# Patient Record
Sex: Female | Born: 2000 | Race: Black or African American | Hispanic: No | Marital: Single | Smoking: Never smoker
Health system: Southern US, Community
[De-identification: ages and names within clinical notes are randomized; demographics above are authoritative.]

## PROBLEM LIST (undated history)

## (undated) DIAGNOSIS — J45909 Unspecified asthma, uncomplicated: Secondary | ICD-10-CM

---
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
## 2021-01-10 ENCOUNTER — Emergency Department (HOSPITAL_COMMUNITY)
Admission: EM | Admit: 2021-01-10 | Discharge: 2021-01-10 | Disposition: A | Discharge status: Home / Self Care | Payer: Medicaid Other | Attending: Emergency Medicine | Admitting: Emergency Medicine

## 2021-01-10 ENCOUNTER — Other Ambulatory Visit: Payer: Self-pay

## 2021-01-10 ENCOUNTER — Encounter (HOSPITAL_COMMUNITY): Payer: Self-pay | Admitting: Emergency Medicine

## 2021-01-10 DIAGNOSIS — R070 Pain in throat: Secondary | ICD-10-CM | POA: Diagnosis present

## 2021-01-10 DIAGNOSIS — M25552 Pain in left hip: Secondary | ICD-10-CM | POA: Diagnosis not present

## 2021-01-10 DIAGNOSIS — J029 Acute pharyngitis, unspecified: Secondary | ICD-10-CM | POA: Insufficient documentation

## 2021-01-10 DIAGNOSIS — Z20822 Contact with and (suspected) exposure to covid-19: Secondary | ICD-10-CM | POA: Diagnosis not present

## 2021-01-10 DIAGNOSIS — J45909 Unspecified asthma, uncomplicated: Secondary | ICD-10-CM | POA: Insufficient documentation

## 2021-01-10 LAB — RESP PANEL BY RT-PCR (FLU A&B, COVID) ARPGX2
Influenza A by PCR: NEGATIVE
Influenza B by PCR: NEGATIVE
SARS Coronavirus 2 by RT PCR: NEGATIVE

## 2021-01-10 LAB — GROUP A STREP BY PCR: Group A Strep by PCR: NOT DETECTED

## 2021-01-10 MED ORDER — DEXAMETHASONE 4 MG PO TABS
10.0000 mg | ORAL_TABLET | Freq: Once | ORAL | Status: AC
  Administered 2021-01-10: 10 mg via ORAL
  Filled 2021-01-10: qty 3

## 2021-01-10 NOTE — ED Provider Notes (Signed)
[facility] EMERGENCY DEPARTMENT Provider Note   CSN: [id number] Arrival date & time: 01/10/21  1153     History Chief Complaint  Patient presents with  . Sore Throat    Kristen Strong is a 20 y.o. female.  HPI Presents with 4 days of sore throat.  Has a little bit of pain with swallowing, no difficulty swallowing.  No difficulty breathing.  No chest pain.  No history of blood clots, focal leg swelling.  No abdominal pain or nausea or vomiting.  No significant past medical history and does not take daily medications.  She also notes that she has been evaluated 3 or so times for left hip pain which she describes as in her hip and points to her left gluteus feels like a sharp pain.  Feels it when she sits for too long or drives for long period of time.  Ambulatory.  No radiating pain.  No numbness or tingling in her distal extremities.  No weakness.    Past Medical History:  Diagnosis Date  . Asthma     There are no problems to display for this patient.   History reviewed. No pertinent surgical history.   OB History   No obstetric history on file.     History reviewed. No pertinent family history.  Social History   Tobacco Use  . Smoking status: Never Smoker  . Smokeless tobacco: Never Used    Home Medications Prior to Admission medications   Medication Sig Start Date End Date Taking? Authorizing Provider  benzonatate (TESSALON) 100 MG capsule Take 1 capsule (100 mg total) by mouth every 8 (eight) hours. 09/05/20   Henderly, Britni A, PA-C  naproxen (NAPROSYN) 500 MG tablet Take 1 tablet (500 mg total) by mouth 2 (two) times daily as needed for moderate pain. 12/28/20   Petrucelli, Samantha R, PA-C    Allergies    Other and Shellfish allergy  Review of Systems   Review of Systems  Constitutional: Negative for chills and fever.  HENT: Positive for sore throat. Negative for trouble swallowing.   Eyes: Negative for pain and visual disturbance.   Respiratory: Negative for cough and shortness of breath.   Cardiovascular: Negative for chest pain and palpitations.  Gastrointestinal: Negative for abdominal pain and vomiting.  Genitourinary: Negative for dysuria and hematuria.  Musculoskeletal: Negative for arthralgias and back pain.  Skin: Negative for color change and rash.  Neurological: Negative for seizures and syncope.  All other systems reviewed and are negative.   Physical Exam Updated Vital Signs BP 100/61 (BP Location: Right Arm)   Pulse 80   Temp 98.5 F (36.9 C) (Oral)   Resp 16   LMP 01/03/2021   SpO2 97%   Physical Exam Vitals and nursing note reviewed.  Constitutional:      General: She is not in acute distress.    Appearance: She is well-developed.  HENT:     Head: Normocephalic and atraumatic.     Comments: No uvular deviation, focal intraoral swelling.  No cervical lymphadenopathy.    Mouth/Throat:     Mouth: Mucous membranes are moist.     Pharynx: Oropharynx is clear. No oropharyngeal exudate or posterior oropharyngeal erythema.  Eyes:     Conjunctiva/sclera: Conjunctivae normal.  Cardiovascular:     Rate and Rhythm: Normal rate and regular rhythm.     Heart sounds: No murmur heard.   Pulmonary:     Effort: Pulmonary effort is normal. No respiratory distress.  Breath sounds: Normal breath sounds. No wheezing.  Abdominal:     Palpations: Abdomen is soft.     Tenderness: There is no abdominal tenderness.  Musculoskeletal:        General: No swelling or deformity.     Cervical back: Neck supple.     Comments: Left ischial ttp. LLE NVI SLR negative on the L  Skin:    General: Skin is warm and dry.  Neurological:     Mental Status: She is alert.     Comments:   MOTOR: 5/5 in both upper and lower extremities   SENSORY: Equal and symmetric in bilateral upper and lower extremities   COORD: Gait normal     ED Results / Procedures / Treatments   Labs (all labs ordered are listed, but  only abnormal results are displayed) Labs Reviewed  RESP PANEL BY RT-PCR (FLU A&B, COVID) ARPGX2  GROUP A STREP BY PCR    EKG None  Radiology No results found.  Procedures Procedures   Medications Ordered in ED Medications  dexamethasone (DECADRON) tablet 10 mg (10 mg Oral Given 01/10/21 1424)    ED Course  I have reviewed the triage vital signs and the nursing notes.  Pertinent labs & imaging results that were available during my care of the patient were reviewed by me and considered in my medical decision making (see chart for details).    MDM Rules/Calculators/A&P                          Patient presents with sore throat.  Strep and COVID test are negative.  No signs of emergent upper airway complication or bacterial infection or bacterial sinusitis or difficulty breathing or swallowing.  Her left lower extremity is neurovascular intact, she has had no trauma to explain her musculoskeletal findings, I offered x-ray but we agreed this is not necessary because it would likely be negative.  Her pain does seem musculoskeletal in nature and there are no red flag symptoms such as urinary retention or difficulty with defecation or numbness or tingling down her legs or weakness.  I provided her the information for sports medicine so she can follow-up to consider further thorough diagnostic testing and management.  Final Clinical Impression(s) / ED Diagnoses Final diagnoses:  Pharyngitis, unspecified etiology    Rx / DC Orders ED Discharge Orders    None       Jacklynn Bue, MD 01/11/21 1756    Melene Plan, DO 01/11/21 2304

## 2021-01-10 NOTE — ED Triage Notes (Signed)
Pt states she has a sore throat when she swallows for four days. Pt has had pain in her lower back for 3 months. Pt has been seen for it in the past, with no relief.

## 2021-01-10 NOTE — Discharge Instructions (Addendum)
Your strep and COVID test are negative.  You received Decadron in the ER which can help with your sore throat as well as your gluteal pain.  The sports medicine phone number is listed for follow-up due to your pain in your hip.  Please come back if you have difficulty breathing or difficulty swallowing, lightheadedness, passing out, or other emergencies.  You may continue to use ibuprofen and Tylenol for pain until you follow-up with sports medicine

## 2021-01-10 NOTE — ED Provider Notes (Signed)
Emergency Medicine Provider Triage Evaluation Note  Kristen Strong , a 20 y.o. female  was evaluated in triage.  Pt complains of sore throat x4 days.  Review of Systems  Positive: Sore throat Negative: Fever, cough, congestion  Physical Exam  BP 105/69 (BP Location: Left Arm)   Pulse (!) 101   Temp 99 F (37.2 C) (Oral)   Resp 17   LMP 01/03/2021   SpO2 97%  Gen:   Awake, no distress   Resp:  Normal effort  MSK:   Moves extremities without difficulty  Other:  No oropharyngeal erythema, or exudate. No unilateral tonsillar swelling.  Medical Decision Making  Medically screening exam initiated at 12:29 PM.  Appropriate orders placed.  Kristen Strong was informed that the remainder of the evaluation will be completed by another provider, this initial triage assessment does not replace that evaluation, and the importance of remaining in the ED until their evaluation is complete.     Karrie Meres, PA-C 01/10/21 1229    Melene Plan, DO 01/10/21 1536

## 2021-01-11 ENCOUNTER — Ambulatory Visit: Payer: Medicaid Other | Admitting: Family Medicine

## 2021-01-24 ENCOUNTER — Other Ambulatory Visit: Payer: Self-pay

## 2021-01-24 ENCOUNTER — Emergency Department (HOSPITAL_COMMUNITY)
Admission: EM | Admit: 2021-01-24 | Discharge: 2021-01-24 | Disposition: A | Discharge status: Home / Self Care | Payer: Medicaid Other | Attending: Emergency Medicine | Admitting: Emergency Medicine

## 2021-01-24 ENCOUNTER — Encounter (HOSPITAL_COMMUNITY): Payer: Self-pay | Admitting: Emergency Medicine

## 2021-01-24 DIAGNOSIS — Z8669 Personal history of other diseases of the nervous system and sense organs: Secondary | ICD-10-CM | POA: Diagnosis not present

## 2021-01-24 DIAGNOSIS — R42 Dizziness and giddiness: Secondary | ICD-10-CM | POA: Diagnosis not present

## 2021-01-24 DIAGNOSIS — W010XXA Fall on same level from slipping, tripping and stumbling without subsequent striking against object, initial encounter: Secondary | ICD-10-CM | POA: Diagnosis not present

## 2021-01-24 DIAGNOSIS — R55 Syncope and collapse: Secondary | ICD-10-CM | POA: Diagnosis present

## 2021-01-24 DIAGNOSIS — R519 Headache, unspecified: Secondary | ICD-10-CM | POA: Insufficient documentation

## 2021-01-24 DIAGNOSIS — W19XXXA Unspecified fall, initial encounter: Secondary | ICD-10-CM

## 2021-01-24 NOTE — ED Triage Notes (Signed)
Patient reports slip and fall getting out of shower today hitting head with LOC.

## 2021-01-24 NOTE — Discharge Instructions (Signed)
You came to the emergency department to be evaluated for your fall and head injury.  Your exam was unremarkable.  Low suspicion for cranial abnormality at this time.  Get help right away if: You have: A severe headache that is not helped by medicine. Trouble walking or weakness in your arms and legs. Clear or bloody fluid coming from your nose or ears. Changes in your vision. A seizure. Increased confusion or irritability. Your symptoms get worse. You are sleepier than normal and have trouble staying awake. You lose your balance. Your pupils change size. Your speech is slurred. Your dizziness gets worse. You vomit.

## 2021-01-24 NOTE — ED Provider Notes (Signed)
[facility]-EMERGENCY DEPT Provider Note   CSN: [id number] Arrival date & time: 01/24/21  1404     History Chief Complaint  Patient presents with  . Fall    Kristen Strong is a 20 y.o. female with past medical history of asthma.  Patient presents with chief complaint of fall.  She reports that she fell approximately 1030 this morning.  Patient reports that she slipped on a wet floor.  Patient is unsure if she hit her head.  Patient believes that she had an episode of loss of consciousness.  Patient unsure how long this episode occurred 4.  Patient awoke with dizziness, into the back of her head and headache.  At present patient rates her headache 4/10 on the pain scale.  Patient denies any alleviating or aggravating factors.  This is not the worst headache of her life.  Patient denies any aggravation of headache with exertion.  Patient has history of migraines.  Patient has not taken any medication for her headache.  Patient rates pain to the back of her head 3/10 the pain scale.  No alleviating or aggravating factors.  No radiation of pain.  Patient unsure if she has any contusions or wounds to her head.  Patient denies any neck pain, back pain, nausea, vomiting, visual disturbance, numbness to extremities, weakness to extremities, saddle anesthesia, bowel or bladder dysfunction, memory loss, seizures, asymmetry, slurred speech.    Patient is not on any blood thinners.  She denies any alcohol or drug use today.  HPI     Past Medical History:  Diagnosis Date  . Asthma     There are no problems to display for this patient.   History reviewed. No pertinent surgical history.   OB History   No obstetric history on file.     No family history on file.  Social History   Tobacco Use  . Smoking status: Never Smoker  . Smokeless tobacco: Never Used    Home Medications Prior to Admission medications   Medication Sig Start Date End Date Taking? Authorizing  Provider  benzonatate (TESSALON) 100 MG capsule Take 1 capsule (100 mg total) by mouth every 8 (eight) hours. 09/05/20   Henderly, Britni A, PA-C  naproxen (NAPROSYN) 500 MG tablet Take 1 tablet (500 mg total) by mouth 2 (two) times daily as needed for moderate pain. 12/28/20   Petrucelli, Samantha R, PA-C    Allergies    Other and Shellfish allergy  Review of Systems   Review of Systems  HENT: Negative for facial swelling.   Eyes: Negative for visual disturbance.  Respiratory: Negative for shortness of breath.   Cardiovascular: Negative for chest pain.  Gastrointestinal: Negative for nausea and vomiting.  Genitourinary: Negative for difficulty urinating and dysuria.  Musculoskeletal: Negative for back pain, neck pain and neck stiffness.  Skin: Negative for color change, pallor, rash and wound.  Neurological: Positive for dizziness and headaches. Negative for tremors, seizures, syncope, facial asymmetry, speech difficulty, weakness, light-headedness and numbness.  Hematological: Does not bruise/bleed easily.  Psychiatric/Behavioral: Negative for confusion.    Physical Exam Updated Vital Signs BP 108/74 (BP Location: Left Arm)   Pulse (!) 106   Temp 98.6 F (37 C) (Oral)   Resp 16   LMP 01/24/2021   SpO2 96%   Physical Exam Vitals and nursing note reviewed.  Constitutional:      General: She is not in acute distress.    Appearance: She is not ill-appearing, toxic-appearing or diaphoretic.  HENT:     Head: Normocephalic and atraumatic. No raccoon eyes, Battle's sign, abrasion, contusion, masses, right periorbital erythema, left periorbital erythema or laceration.     Jaw: No trismus or pain on movement.     Comments: No contusion, bleeding, or swelling to patient's head    Right Ear: No drainage.     Left Ear: No drainage.     Nose: No rhinorrhea.  Eyes:     General: No scleral icterus.       Right eye: No discharge.        Left eye: No discharge.     Extraocular  Movements: Extraocular movements intact.     Pupils: Pupils are equal, round, and reactive to light.  Cardiovascular:     Rate and Rhythm: Normal rate.  Pulmonary:     Effort: Pulmonary effort is normal. No respiratory distress.     Breath sounds: No stridor.     Comments: Patient able speak in full sentences without difficulty Abdominal:     Palpations: Abdomen is soft.     Tenderness: There is no abdominal tenderness.  Musculoskeletal:     Cervical back: Normal range of motion and neck supple. No swelling, edema, deformity, erythema, signs of trauma, lacerations, rigidity, spasms, torticollis, tenderness, bony tenderness or crepitus. No pain with movement, spinous process tenderness or muscular tenderness. Normal range of motion.     Thoracic back: No swelling, edema, deformity, signs of trauma, lacerations, spasms, tenderness or bony tenderness.     Lumbar back: No swelling, edema, deformity, signs of trauma, lacerations, spasms, tenderness or bony tenderness.     Comments: No midline tenderness bony to cervical, thoracic, or lumbar spine  Tenderness, deformity, or bony tenderness to bilateral upper or lower extremities  Skin:    General: Skin is warm and dry.     Coloration: Skin is not jaundiced or pale.  Neurological:     General: No focal deficit present.     Mental Status: She is alert and oriented to person, place, and time.     GCS: GCS eye subscore is 4. GCS verbal subscore is 5. GCS motor subscore is 6.     Cranial Nerves: No cranial nerve deficit or facial asymmetry.     Sensory: Sensation is intact.     Motor: No weakness, tremor, seizure activity or pronator drift.     Coordination: Romberg sign negative. Finger-Nose-Finger Test normal.     Gait: Gait is intact. Gait and tandem walk normal.     Comments: CN II-XII intact, equal grip strength, +5 strength to bilateral upper and lower extremities, sensation to light touch intact to bilateral upper and lower extremities    Psychiatric:        Behavior: Behavior is cooperative.     ED Results / Procedures / Treatments   Labs (all labs ordered are listed, but only abnormal results are displayed) Labs Reviewed - No data to display  EKG None  Radiology No results found.  Procedures Procedures   Medications Ordered in ED Medications - No data to display  ED Course  I have reviewed the triage vital signs and the nursing notes.  Pertinent labs & imaging results that were available during my care of the patient were reviewed by me and considered in my medical decision making (see chart for details).    MDM Rules/Calculators/A&P  Alert 20 year old female no acute stress, nontoxic.  Patient presents with complaint of mechanical fall.  Patient endorses loss of consciousness.  On physical exam patient has no focal neurological deficits.  No tenderness or deformity to cervical, thoracic, or lumbar spine.  Head is atraumatic.  No deformity, tenderness, or bony tenderness to bilateral upper and lower extremities.  Low suspicion for intracranial abnormality or spinal injury.  CT head imaging ruled out by CT and head injury patient's prediction rule.    Patient given strict return precautions.  Patient expressed understanding of all instructions and is agreeable with this plan.   Final Clinical Impression(s) / ED Diagnoses Final diagnoses:  Fall, initial encounter    Rx / DC Orders ED Discharge Orders    None       Haskel Schroeder, PA-C 01/25/21 0244    Vanetta Mulders, MD 01/26/21 [phone]

## 2021-02-03 ENCOUNTER — Emergency Department (HOSPITAL_COMMUNITY)
Admission: EM | Admit: 2021-02-03 | Discharge: 2021-02-04 | Disposition: A | Discharge status: Home / Self Care | Payer: Medicaid Other | Attending: Emergency Medicine | Admitting: Emergency Medicine

## 2021-02-03 DIAGNOSIS — R5383 Other fatigue: Secondary | ICD-10-CM | POA: Diagnosis not present

## 2021-02-03 DIAGNOSIS — M549 Dorsalgia, unspecified: Secondary | ICD-10-CM | POA: Insufficient documentation

## 2021-02-03 DIAGNOSIS — J45909 Unspecified asthma, uncomplicated: Secondary | ICD-10-CM | POA: Insufficient documentation

## 2021-02-03 DIAGNOSIS — R6883 Chills (without fever): Secondary | ICD-10-CM | POA: Insufficient documentation

## 2021-02-03 DIAGNOSIS — R0981 Nasal congestion: Secondary | ICD-10-CM | POA: Insufficient documentation

## 2021-02-03 DIAGNOSIS — Z8616 Personal history of COVID-19: Secondary | ICD-10-CM | POA: Insufficient documentation

## 2021-02-03 DIAGNOSIS — Z20822 Contact with and (suspected) exposure to covid-19: Secondary | ICD-10-CM | POA: Diagnosis not present

## 2021-02-03 NOTE — ED Triage Notes (Signed)
Patient arrived with complaints of chills, cough, and runny nose since yesterday, known recent covid-19 exposure.   Roughly 4 months ago she also started having pain from her tailbone that is now radiating to bilateral legs and upper back causing pain and tingling. Stating she has trouble walking down stairs. No known injury.

## 2021-02-04 ENCOUNTER — Emergency Department (HOSPITAL_COMMUNITY): Payer: Medicaid Other

## 2021-02-04 ENCOUNTER — Encounter (HOSPITAL_COMMUNITY): Payer: Self-pay

## 2021-02-04 LAB — SARS CORONAVIRUS 2 (TAT 6-24 HRS): SARS Coronavirus 2: NEGATIVE

## 2021-02-04 LAB — POC URINE PREG, ED: Preg Test, Ur: NEGATIVE

## 2021-02-04 NOTE — ED Provider Notes (Signed)
[facility]-EMERGENCY DEPT Provider Note   CSN: [id number] Arrival date & time: 02/03/21  2318     History Chief Complaint  Patient presents with  . Covid Exposure  . Leg Pain    Kristen Strong is a 20 y.o. female.  HPI  20 y.o. F with hx of asthma, presenting to the ED for multiple concerns.  1.  covid exposure-- states onset of chills, body aches, fatigue, and nasal congestion since yesterday.  covid has been spreading around her coworkers.  Reports she had covid already in Jan 2022.  Denies chest pain or SOB.  2.  Back pain-- has been ongoing for about 4 months now.  Triage note reports a fall but she denies this or any other trauma.  States she just has pain all the time, radiates between low back and tailbone.  Worse with certain movements like going up the stairs. No leg numbness/weakness.  No bowel or bladder incontinence.  States she has been seen in the ED and urgent care a few times for this without change.  Past Medical History:  Diagnosis Date  . Asthma     There are no problems to display for this patient.   History reviewed. No pertinent surgical history.   OB History   No obstetric history on file.     No family history on file.  Social History   Tobacco Use  . Smoking status: Never Smoker  . Smokeless tobacco: Never Used    Home Medications Prior to Admission medications   Medication Sig Start Date End Date Taking? Authorizing Provider  albuterol (VENTOLIN HFA) 108 (90 Base) MCG/ACT inhaler Inhale 1-2 puffs into the lungs every 6 (six) hours as needed for wheezing or shortness of breath.   Yes [provider]  escitalopram (LEXAPRO) 5 MG tablet Take 5 mg by mouth daily as needed. 10/12/20  Yes [provider]  SUMAtriptan (IMITREX) 25 MG tablet Take 25 mg by mouth 2 (two) times daily as needed. 10/12/20  Yes [provider]  benzonatate (TESSALON) 100 MG capsule Take 1 capsule (100 mg total) by mouth every 8  (eight) hours. Patient not taking: Reported on 02/04/2021 09/05/20   Henderly, Britni A, PA-C  naproxen (NAPROSYN) 500 MG tablet Take 1 tablet (500 mg total) by mouth 2 (two) times daily as needed for moderate pain. Patient not taking: Reported on 02/04/2021 12/28/20   Petrucelli, Pleas Koch, PA-C    Allergies    Other and Shellfish allergy  Review of Systems   Review of Systems  Physical Exam Updated Vital Signs BP 125/79 (BP Location: Left Arm)   Pulse 95   Temp 98.6 F (37 C) (Oral)   Resp 19   Ht 5' (1.524 m)   Wt 54.4 kg   LMP 02/04/2021   SpO2 98%   BMI 23.44 kg/m   Physical Exam Vitals and nursing note reviewed.  Constitutional:      Appearance: She is well-developed.  HENT:     Head: Normocephalic and atraumatic.  Eyes:     Conjunctiva/sclera: Conjunctivae normal.     Pupils: Pupils are equal, round, and reactive to light.  Cardiovascular:     Rate and Rhythm: Normal rate and regular rhythm.     Heart sounds: Normal heart sounds.  Pulmonary:     Effort: Pulmonary effort is normal.     Breath sounds: Normal breath sounds.  Abdominal:     General: Bowel sounds are normal.  Palpations: Abdomen is soft.  Musculoskeletal:        General: Normal range of motion.     Cervical back: Normal range of motion.     Comments: No tenderness noted on exam of lumbar spine or sacrum, no signs of trauma; normal strength/sensation of both legs, ambulatory  Skin:    General: Skin is warm and dry.  Neurological:     Mental Status: She is alert and oriented to person, place, and time.     ED Results / Procedures / Treatments   Labs (all labs ordered are listed, but only abnormal results are displayed) Labs Reviewed  SARS CORONAVIRUS 2 (TAT 6-24 HRS)  POC URINE PREG, ED    EKG None  Radiology DG Lumbar Spine Complete  Result Date: 02/04/2021 CLINICAL DATA:  Chronic low back pain EXAM: LUMBAR SPINE - COMPLETE 4+ VIEW COMPARISON:  None. FINDINGS: There is no evidence of  lumbar spine fracture. Alignment is normal. Intervertebral disc spaces are maintained. IMPRESSION: Negative. Electronically Signed   By: Helyn Numbers MD   On: 02/04/2021 01:31    Procedures Procedures   Medications Ordered in ED Medications - No data to display  ED Course  I have reviewed the triage vital signs and the nursing notes.  Pertinent labs & imaging results that were available during my care of the patient were reviewed by me and considered in my medical decision making (see chart for details).    MDM Rules/Calculators/A&P  20 year old female here with multiple concerns.  1.  COVID exposure--new onset of chills, body aches, fatigue over the past 24 hours.  Multiple COVID exposures at work.  Denies chest pain or shortness of breath.  Vitals are stable here.  COVID screen sent, will be notified with results.  2.  Back pain--ongoing for about 4 months now.  Triage note indicates fall, however patient denies this and states it was no injury.  Reports pain radiating between low back and tailbone.  She has no strength or sensory deficit on exam suggestive of cauda equina.  No red flag symptoms such as fever, cancer, weight loss.  She has no history of IV drug use.  X-ray is negative.  Uncertain etiology without inciting event.  Will refer to orthopedics.  Return here for new concerns.  Final Clinical Impression(s) / ED Diagnoses Final diagnoses:  Close exposure to COVID-19 virus  Back pain, unspecified back location, unspecified back pain laterality, unspecified chronicity    Rx / DC Orders ED Discharge Orders    None       Garlon Hatchet, PA-C 02/04/21 0214    Palumbo, April, MD 02/04/21 [id number]

## 2021-02-04 NOTE — Discharge Instructions (Signed)
You will be contacted if covid test is positive. Follow-up with Dr. August Saucer about your back-- can call his office to schedule appt.

## 2021-02-08 ENCOUNTER — Ambulatory Visit (INDEPENDENT_AMBULATORY_CARE_PROVIDER_SITE_OTHER): Payer: Medicaid Other | Admitting: Surgical

## 2021-02-08 ENCOUNTER — Encounter: Payer: Self-pay | Admitting: Surgical

## 2021-02-08 DIAGNOSIS — M5441 Lumbago with sciatica, right side: Secondary | ICD-10-CM | POA: Diagnosis not present

## 2021-02-08 DIAGNOSIS — M5442 Lumbago with sciatica, left side: Secondary | ICD-10-CM | POA: Diagnosis not present

## 2021-02-08 MED ORDER — PREDNISONE 5 MG (21) PO TBPK: ORAL_TABLET | ORAL | 0 refills | Status: AC

## 2021-02-08 NOTE — Progress Notes (Signed)
Office Visit Note   Patient: Kristen Strong           Date of Birth: 05-03-2001           MRN: [id number] Visit Date: 02/08/2021 Requested by: No referring provider defined for this encounter. PCP: System, Provider Not In  Subjective: Chief Complaint  Patient presents with   Lower Back - Pain    HPI: Kristen Strong is a 20 y.o. female who presents to the office complaining of back pain.  Patient reports that she contracted COVID about 4 months ago.  She has had 4 months of worsening back pain since the onset of COVID.  Localizes the majority of her pain to the sacrum and states that she has extreme pain with sitting in this location.  This pain is been worsening and is gotten to the point where it even hurts in the sacrum when she walks after sitting for a period of time.  She feels her legs are weak at times subjectively after she gets up from sitting position and feels like they want to give out on her.  She denies any injury to her back or any previous difficulty with her back prior to 4 months ago.  Only medical history is significant for asthma and she has no change in her pain or symptoms with menstruation.  She has been taking Tylenol and Advil without any relief.  She denies any bowel/bladder incontinence.  She also reports new radicular pain down her legs that stops at her knee though this is not nearly bothering her as much as the sacral pain.  Left radicular pain travels down and stops at the knee and bothers her more than the right radicular pain that stops at the knee.  Denies any surgery to her back in the past.  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                ROS: All systems reviewed are negative as they relate to the chief complaint within the history of present illness.  Patient denies fevers or chills.  Assessment & Plan: Visit Diagnoses:  1. Bilateral low back pain with bilateral  sciatica, unspecified chronicity     Plan: Patient is a 20 year old female who presents complaint of 4 months of worsening pain, mostly localized to the coccyx.  More recently she has had associated radicular symptoms though the majority of her pain still originates from her coccyx.  She has increased difficulty sitting and pain even with walking after long periods of sitting.  Moderate to severe tenderness on exam today.  No red flag symptoms.  No weakness on exam.  Impression is coccydynia with no known injury.  Radiographs from the emergency department visit were reviewed and there is no acute pathology to explain patient's pain in the last 4 months.  No pars defect noted.  Plan to obtain MRI of the sacrum for further evaluation of the coccyx and follow-up to review MRI results.  Follow-Up Instructions: No follow-ups on file.   Orders:  Orders Placed This Encounter  Procedures   MR Lumbar Spine w/o contrast   MR SACRUM SI JOINTS WO CONTRAST   Meds ordered this encounter  Medications   predniSONE (STERAPRED UNI-PAK 21 TAB) 5 MG (21) TBPK tablet    Sig: Take dosepak as directed    Dispense:  21 tablet    Refill:  0      Procedures: No procedures performed   Clinical Data: No additional findings.  Objective: Vital Signs: LMP 02/04/2021   Physical Exam:  Constitutional: Patient appears well-developed HEENT:  Head: Normocephalic Eyes:EOM are normal Neck: Normal range of motion Cardiovascular: Normal rate Pulmonary/chest: Effort normal Neurologic: Patient is alert Skin: Skin is warm Psychiatric: Patient has normal mood and affect  Ortho Exam: Ortho exam demonstrates mild to moderate tenderness throughout the axial lumbar spine.  No tenderness to the paraspinal musculature.  Moderate to severe tenderness over the coccyx.  No pilonidal cyst is noted.  No tenderness over the proximal hamstring insertion.  No pain with hip range of motion.  5/5 motor strength of bilateral hip  flexor, quadricep, hamstring, dorsiflexion, plantarflexion.  No calf tenderness.  Negative Homans' sign.  Mildly positive straight leg raise bilaterally.  Specialty Comments:  No specialty comments available.  Imaging: No results found.   PMFS History: There are no problems to display for this patient.  Past Medical History:  Diagnosis Date   Asthma     No family history on file.  No past surgical history on file. Social History   Occupational History   Not on file  Tobacco Use   Smoking status: Never   Smokeless tobacco: Never  Substance and Sexual Activity   Alcohol use: Not on file   Drug use: Not on file   Sexual activity: Not on file

## 2021-02-23 ENCOUNTER — Other Ambulatory Visit: Payer: Medicaid Other

## 2021-03-08 ENCOUNTER — Ambulatory Visit (INDEPENDENT_AMBULATORY_CARE_PROVIDER_SITE_OTHER): Payer: Medicaid Other | Admitting: Surgical

## 2021-03-08 ENCOUNTER — Ambulatory Visit (INDEPENDENT_AMBULATORY_CARE_PROVIDER_SITE_OTHER): Payer: Medicaid Other

## 2021-03-08 DIAGNOSIS — R296 Repeated falls: Secondary | ICD-10-CM

## 2021-03-08 DIAGNOSIS — M533 Sacrococcygeal disorders, not elsewhere classified: Secondary | ICD-10-CM | POA: Diagnosis not present

## 2021-03-08 DIAGNOSIS — R202 Paresthesia of skin: Secondary | ICD-10-CM | POA: Diagnosis not present

## 2021-03-08 DIAGNOSIS — M5441 Lumbago with sciatica, right side: Secondary | ICD-10-CM

## 2021-03-09 ENCOUNTER — Encounter: Payer: Self-pay | Admitting: Surgical

## 2021-03-09 NOTE — Progress Notes (Signed)
Office Visit Note   Patient: Kristen Strong           Date of Birth: [birth year]           MRN: [id number] Visit Date: 03/08/2021 Requested by: No referring provider defined for this encounter. PCP: System, Provider Not In  Subjective: Chief Complaint  Patient presents with   Spine - Follow-up    HPI: Kristen Strong is a 20 y.o. female who presents to the office complaining of continued coccydynia.  Patient returns for repeat evaluation of coccydynia that has been going on since she was initially diagnosed with COVID-19 about 5 months ago.  No history of injury and she reports progressive pain even since the last office appointment.  MRI scans that were ordered were denied by her insurance.  She reports pain is getting worse and she is now making efforts to avoid doing any sort of sitting on her buttocks.  She has pain at night and pain and bothers her at rest but is especially worsened by sitting directly on the coccyx/sacrum.  When she is studying for school, she has to lay on her side or on her stomach or stand.  Aside from the coccyx pain, she continues to endorse paresthesias down her legs anteriorly that travels to her knees bilaterally.  She also reports leg weakness with both legs feel like they want a give out on her.  This instability is becoming more common and she has had 4 total falls due to her legs giving out on her.  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  This has become so troublesome for her that she ended up quitting her job as she did not want to fall while holding a child.  She denies any bowel or bladder incontinence or saddle anesthesia.  She denies any family history of neurologic problems but she is adopted and does not know much of her biologic parents family history aside from the fact that her biologic dad died of a stroke at 52 years old.  In addition to the symptoms discussed at her last visit, she also endorses noticing some  vision changes over the last several months that is new for her.  She has occasional pain in her right eye that causes eye twitching and she has to rub her eye intermittently. She always "sees stars" for 5 to 10 minutes after waking up while she is walking her dog early in the morning. The eye twitching begins randomly but especially after using a screen or doing school work and lasts for several minutes. She has no episodes of vision loss.  She also reports bilateral numbness and tingling in both hands that bothers her throughout the day but mostly at night.  She describes the numbness and tingling in all 5 fingers of both hands on the palmar side of the hand with no neck pain or radicular symptoms down the arms.  Kristen Strong also describes an intention tremor with her right hand that she has not had before; she has noticed this more in the last several months and is becoming more noticeable for her.  She has had a couple incidents of being unable to wiggle her toes in the last week where she "tells her brain to move my toes but they just will not work".                ROS: All systems reviewed are negative  as they relate to the chief complaint within the history of present illness.  Patient denies fevers or chills.  Assessment & Plan: Visit Diagnoses:  1. Nontraumatic coccydynia   2. Paresthesia of left arm and leg   3. Paresthesia of right arm and leg   4. Multiple falls     Plan: Patient is an 20 year old female who presents for repeat evaluation of severe coccydynia.  She was last seen about 1 month ago and described progressively worsening pain in her coccyx over the last 5 months.  MRI scan of the sacrum was ordered for further evaluation but was denied by her insurance citing lack of 6 weeks of conservative management.  No lasting improvement from steroid dosepack prescribed at last visit. Peer to peer has been set up in order to get this approved.  No clear-cut cause of her coccydynia with lack of any  traumatic history and no radiographic findings on today's repeated radiographs.  Need advanced imaging in order to rule out chordoma and evaluate for pathology that may be causing her pain and symptoms of parasthesias down both legs.   Peer to peer was performed today on 03/11/2021 and spoke with Dr. Efraim Kaufmann Strong who is pediatric internal medicine physician who advised that sacral MRI cannot be approved without 6 weeks of dedicated physical therapy program or home exercise program.  This is not appropriate given the night and rest pain she is having with severe progression that is not responding to NSAIDs or steroid Dosepak.  Physical therapy is not an appropriate solution to a potential mass of the sacrum.  Plan to appeal this decision.  Patient endorses worsening pain in her coccyx since last visit but also reports neurologic symptoms with continued paresthesias in her bilateral anterior thighs as well as new paresthesias in both hands, intention tremor with both hands right>left, loss of balance/falling, vision changes/eye twitching.  She has no significant medical history to explain the symptoms and she has really only noticed the symptoms in the last several months which could represent sequela from COVID-19 infection.  Plan to order additional brain MRI with and without contrast for workup of potential demyelinating disease and refer patient to neurology for further evaluation of neurologic symptoms.  On today's exam she has no gross motor weakness to explain why she is having multiple falls aside from some left hamstring weakness compared with contralateral side but this does not really explain why she is having multiple falls where both legs feel like they give out on her..  Follow-Up Instructions: No follow-ups on file.   Orders:  Orders Placed This Encounter  Procedures   XR Sacrum/Coccyx   No orders of the defined types were placed in this encounter.     Procedures: No procedures  performed   Clinical Data: No additional findings.  Objective: Vital Signs: There were no vitals taken for this visit.  Physical Exam:  Constitutional: Patient appears well-developed HEENT:  Head: Normocephalic Eyes:EOM are normal Neck: Normal range of motion Cardiovascular: Normal rate Pulmonary/chest: Effort normal Neurologic: Patient is alert Skin: Skin is warm Psychiatric: Patient has normal mood and affect  Ortho Exam: Ortho exam demonstrates severe point tenderness over the coccyx with more mild to moderate tenderness over the axial sacrum.  Mild tenderness at the inferior aspect of the axial lumbar spine.  No increased pain with passive extension or flexion of the lumbar spine.  Negative straight leg raise of right and left legs.  5/5 motor strength of bilateral hip flexor,  quadricep, hamstring, dorsiflexion, plantarflexion with exception of 4/5 motor strength of left hamstring.  Patellar tendon reflexes are equivalent bilaterally and are graded 2+.  No evidence of clonus bilaterally.  Negative Babinski sign. No spasticity on exam throughout upper or lower extremities  5/5 motor strength of bilateral grip strength, finger abduction, pronation/supination, bicep, tricep, deltoid.  No pain with cervical spine range of motion.  Negative Spurling sign.  Negative Hoffmann sign.  Extraocular movements are intact with both eyes but patient's right eye has increased twitching when looking to her right and causes her to have to rub her right eye with some discomfort.  No muscle wasting noted throughout either hand.  Negative Tinel signs over the carpal tunnel.  Negative Phalen sign.  Specialty Comments:  No specialty comments available.  Imaging: No results found.   PMFS History: There are no problems to display for this patient.  Past Medical History:  Diagnosis Date   Asthma     No family history on file.  No past surgical history on file. Social History   Occupational  History   Not on file  Tobacco Use   Smoking status: Never   Smokeless tobacco: Never  Substance and Sexual Activity   Alcohol use: Not on file   Drug use: Not on file   Sexual activity: Not on file

## 2021-03-11 ENCOUNTER — Telehealth: Payer: Self-pay | Admitting: Surgical

## 2021-03-11 NOTE — Telephone Encounter (Signed)
Called patient on 03/11/2021 to discuss potential for physical therapy or home exercise program prior to sacrum MRI.  She states that she does not think physical therapy or home exercises would be feasible for her.  She states that she already has difficulty functioning in her daily life as she has severe pain that gets in the way of her school studies at baseline.  She has also had to quit her job due to the pain and instability from her legs.  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  Anytime she has tried several repetitions of these exercises, she has to lay on her stomach for 20 to 30 minutes in severe pain due to the coccygeal pain.  This would significantly worsen her daily life for the next 4 to 6 weeks and thus physical therapy and home exercise program is not feasible for her.  Any recurring exercise program at this point would be more harm than benefit.  Need to proceed with MRI sacrum.

## 2021-03-12 ENCOUNTER — Other Ambulatory Visit: Payer: Self-pay

## 2021-03-12 DIAGNOSIS — M79609 Pain in unspecified limb: Secondary | ICD-10-CM

## 2021-03-20 ENCOUNTER — Inpatient Hospital Stay: Admission: RE | Admit: 2021-03-20 | Payer: Medicaid Other | Source: Ambulatory Visit

## 2021-03-20 ENCOUNTER — Ambulatory Visit
Admission: RE | Admit: 2021-03-20 | Discharge: 2021-03-20 | Disposition: A | Discharge status: Home / Self Care | Payer: Medicaid Other | Source: Ambulatory Visit | Attending: Surgical | Admitting: Surgical

## 2021-03-20 ENCOUNTER — Other Ambulatory Visit: Payer: Self-pay

## 2021-03-20 ENCOUNTER — Telehealth: Payer: Self-pay

## 2021-03-20 DIAGNOSIS — M79609 Pain in unspecified limb: Secondary | ICD-10-CM

## 2021-03-20 DIAGNOSIS — M5442 Lumbago with sciatica, left side: Secondary | ICD-10-CM

## 2021-03-20 DIAGNOSIS — R202 Paresthesia of skin: Secondary | ICD-10-CM

## 2021-03-20 NOTE — Telephone Encounter (Signed)
Kristen Strong was asking if we had a clear reason from insurance whether or note they approved MRI sacrum? See that MRI brain and MRI lumbar spine normal.

## 2021-03-22 NOTE — Telephone Encounter (Signed)
No Franky Macho needs to call For an appeal or p2p [phone] Case tracking #[id number] follow prompts. thanks

## 2021-03-22 NOTE — Telephone Encounter (Signed)
Sent messafe to ladies at [facility] will call to get pt scheduled

## 2021-03-22 NOTE — Telephone Encounter (Signed)
I called and spent 53 minutes on the phone to achieve new tracking number [id number] Approval number: [id number]  They said they will approve this as new authorization for MRI Sacrum as the previous order was for some reason MRI pelvis, can you ensure that the correct scan is done at [facility] imaging and they are aware that we need visualization of the coccyx in its entirety?

## 2021-04-01 ENCOUNTER — Ambulatory Visit
Admission: RE | Admit: 2021-04-01 | Discharge: 2021-04-01 | Disposition: A | Discharge status: Home / Self Care | Payer: Medicaid Other | Source: Ambulatory Visit | Attending: Surgical | Admitting: Surgical

## 2021-04-01 ENCOUNTER — Other Ambulatory Visit: Payer: Self-pay

## 2021-04-01 DIAGNOSIS — M5441 Lumbago with sciatica, right side: Secondary | ICD-10-CM

## 2021-04-01 DIAGNOSIS — M5442 Lumbago with sciatica, left side: Secondary | ICD-10-CM

## 2021-04-05 ENCOUNTER — Ambulatory Visit (INDEPENDENT_AMBULATORY_CARE_PROVIDER_SITE_OTHER): Payer: Medicaid Other | Admitting: Orthopedic Surgery

## 2021-04-05 ENCOUNTER — Ambulatory Visit: Payer: Medicaid Other | Admitting: Surgical

## 2021-04-05 ENCOUNTER — Other Ambulatory Visit: Payer: Self-pay

## 2021-04-05 DIAGNOSIS — M79609 Pain in unspecified limb: Secondary | ICD-10-CM | POA: Diagnosis not present

## 2021-04-05 DIAGNOSIS — R202 Paresthesia of skin: Secondary | ICD-10-CM

## 2021-04-07 ENCOUNTER — Encounter: Payer: Self-pay | Admitting: Orthopedic Surgery

## 2021-04-07 NOTE — Progress Notes (Signed)
   Office Visit Note   Patient: Kristen Strong           Date of Birth: [birth year]           MRN: [id number] Visit Date: 04/05/2021 Requested by: No referring provider defined for this encounter. PCP: System, Provider Not In  Subjective: Chief Complaint  Patient presents with   Other    Scan review    HPI: Patient presents for review of MRI lumbar spine and sacrum.  She is having a lot of focal pain tenderness as well as some radicular symptoms.  The MRI scans are reviewed with the patient including the sacrum.  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  Otherwise there was an unremarkable MRI of the sacrum and coccyx.  Patient is not really having any groin pain or anterior symptoms.  MRI scan of the lumbar spine is also reviewed.  This is unremarkable as well.              ROS: All systems reviewed are negative as they relate to the chief complaint within the history of present illness.  Patient denies  fevers or chills.   Assessment & Plan: Visit Diagnoses: No diagnosis found.  Plan: Impression is focal pain in the sacral region and lower lumbar sacral region.  Scans are unremarkable.  She is having numbness and tingling down both legs.  She is falling down the steps.  She had an MRI of the brain which was normal.  At this time I think there is not a structural orthopedic problem in her axial spine.  Follow-up with neurology for further work-up for possible systemic causes of this problem.  Follow-Up Instructions: No follow-ups on file.   Orders:  No orders of the defined types were placed in this encounter.  No orders of the defined types were placed in this encounter.     Procedures: No procedures performed   Clinical Data: No additional findings.  Objective: Vital Signs: There were no vitals taken for this visit.  Physical Exam:   Constitutional: Patient appears well-developed HEENT:   Head: Normocephalic Eyes:EOM are normal Neck: Normal range of motion Cardiovascular: Normal rate Pulmonary/chest: Effort normal Neurologic: Patient is alert Skin: Skin is warm Psychiatric: Patient has normal mood and affect   Ortho Exam: Ortho exam demonstrates normal gait alignment.  Patient has symmetric muscle tone bilaterally.  Very good ankle dorsiflexion plantarflexion quad hamstring strength.  Some tenderness around the upper sacrum.  No real masses or pilonidal cystic structures or skin changes in this region.  No trochanteric tenderness.  No groin pain with internal ex rotation of the leg.  Pedal pulses palpable.  Negative clonus.  Reflexes symmetric.  Specialty Comments:  No specialty comments available.  Imaging: No results found.   PMFS History: There are no problems to display for this patient.  Past Medical History:  Diagnosis Date   Asthma     History reviewed. No pertinent family history.  History reviewed. No pertinent surgical history. Social History   Occupational History   Not on file  Tobacco Use   Smoking status: Never   Smokeless tobacco: Never  Substance and Sexual Activity   Alcohol use: Not on file   Drug use: Not on file   Sexual activity: Not on file

## 2023-06-18 IMAGING — MR MR LUMBAR SPINE W/O CM
4 of 5 series · 19 of 48 positions shown · non-contrast
Comparison: Lumbar spine radiographs 02/04/2021

CLINICAL DATA: Numbness in legs.

EXAM:
MRI LUMBAR SPINE WITHOUT CONTRAST
TECHNIQUE: Multiplanar, multisequence MR imaging of the lumbar spine was
performed. No intravenous contrast was administered.

[Series 5: T2 · sagittal · 4.0mm · 0.73mm/px · 6 of 15 slices shown (1 of 2)]
[im 1/15]
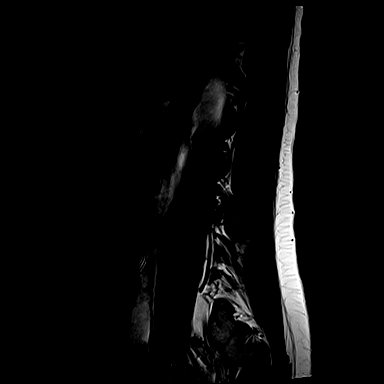
[im 3/15]
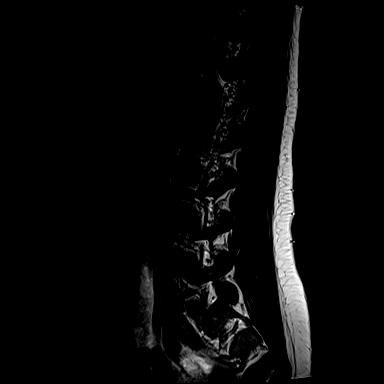
[im 6/15]
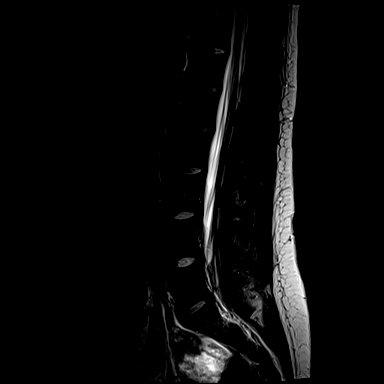
[im 9/15]
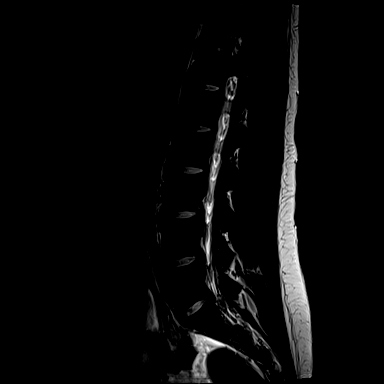
[im 12/15]
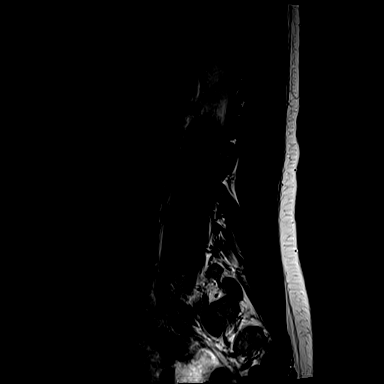
[im 15/15]
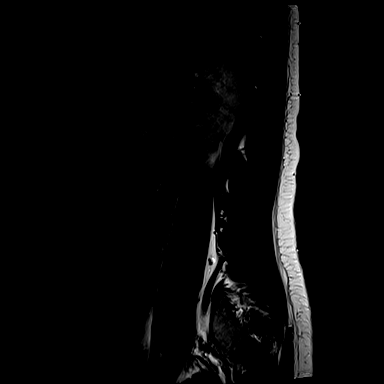

[Series 6: T1 · sagittal · 4.0mm · 0.73mm/px · 3 of 15 slices shown (1 of 2)]
[im 3/15]
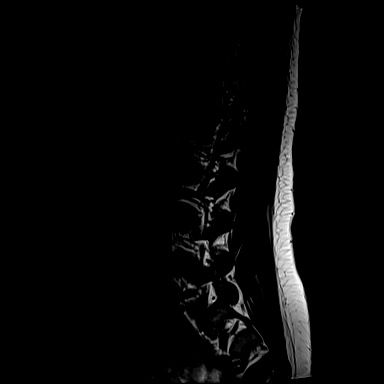
[im 9/15]
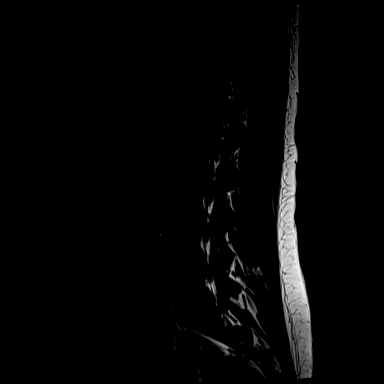
[im 15/15]
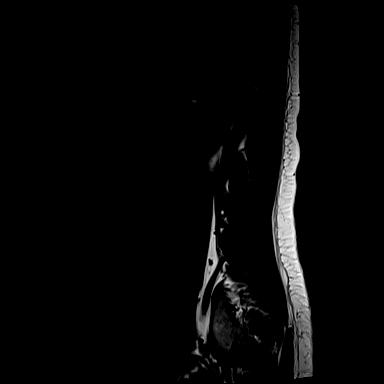

[Series 10: T1 · axial · 4.0mm · 0.28mm/px · z∈[-78,+75]mm · 3 of 37 slices shown (2 of 2)]
[im 6/37]
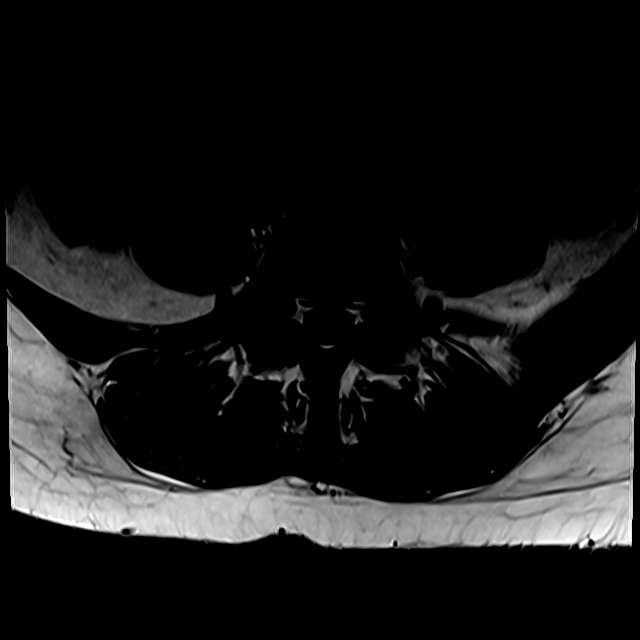
[im 19/37]
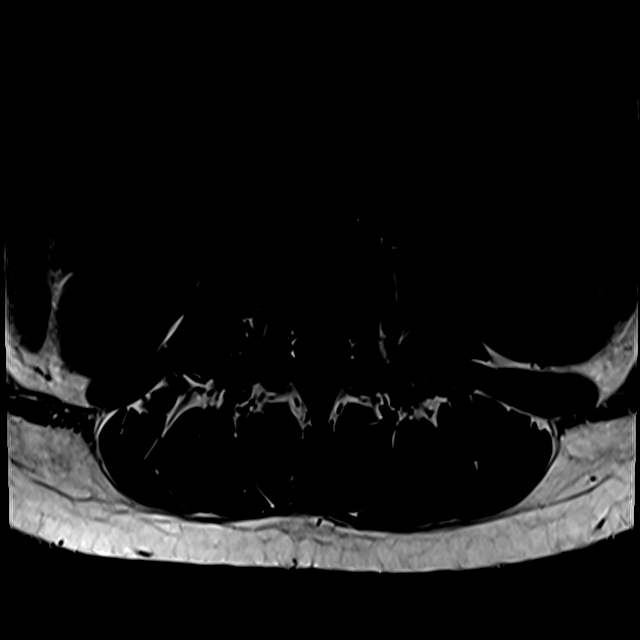
[im 31/37]
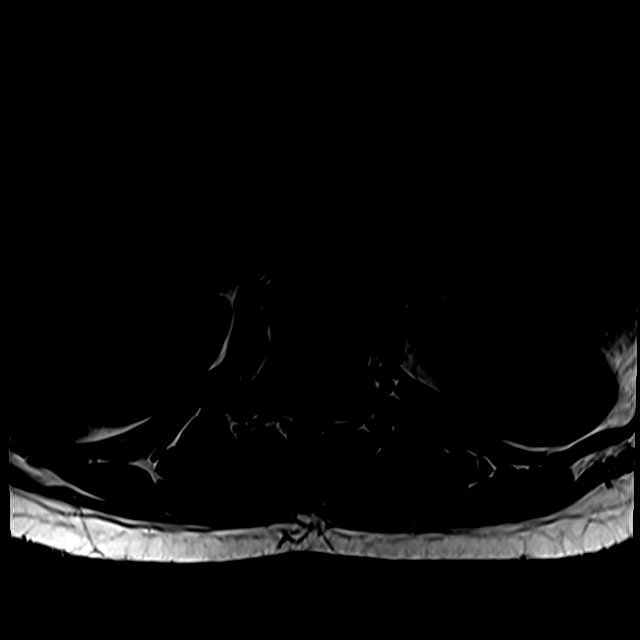

[Series 13: T2 · axial · 4.0mm · 0.28mm/px · z∈[-102,+75]mm · 7 of 37 slices shown (2 of 2)]
[im 1/37]
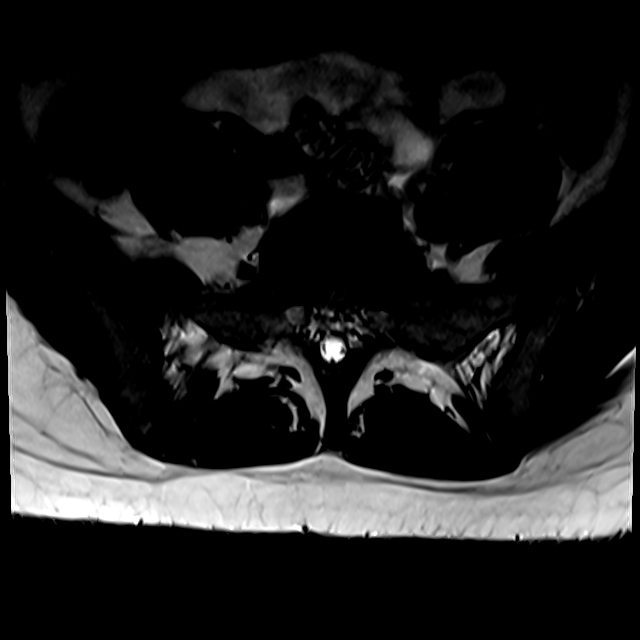
[im 6/37]
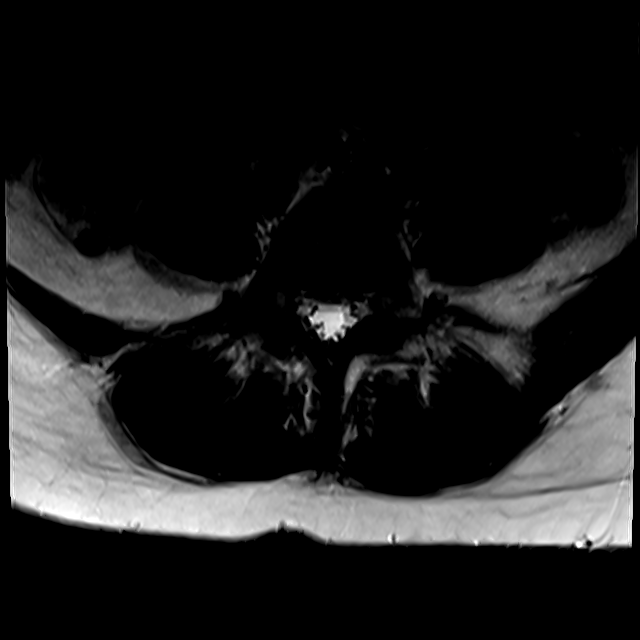
[im 11/37]
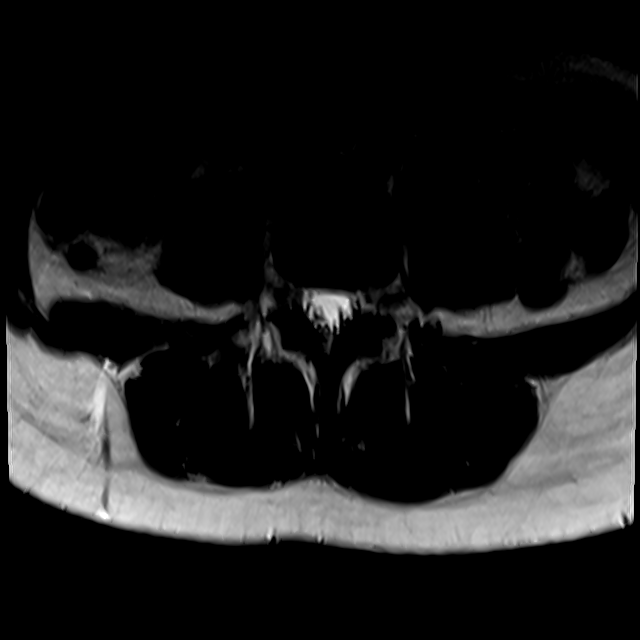
[im 16/37]
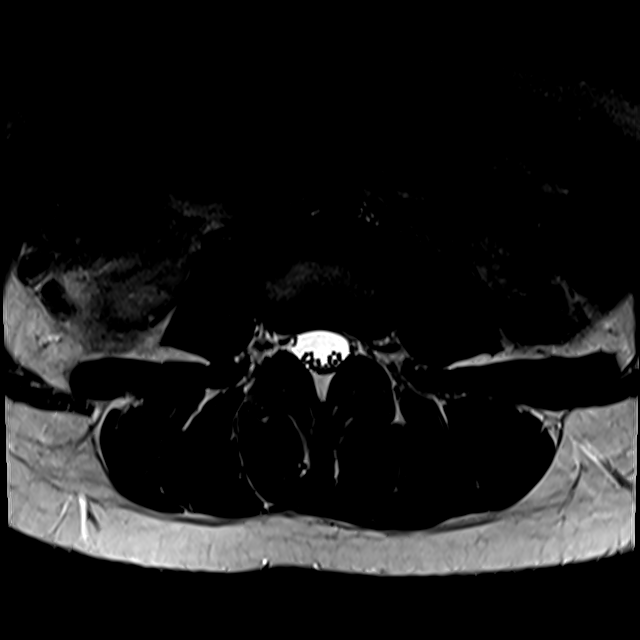
[im 19/37]
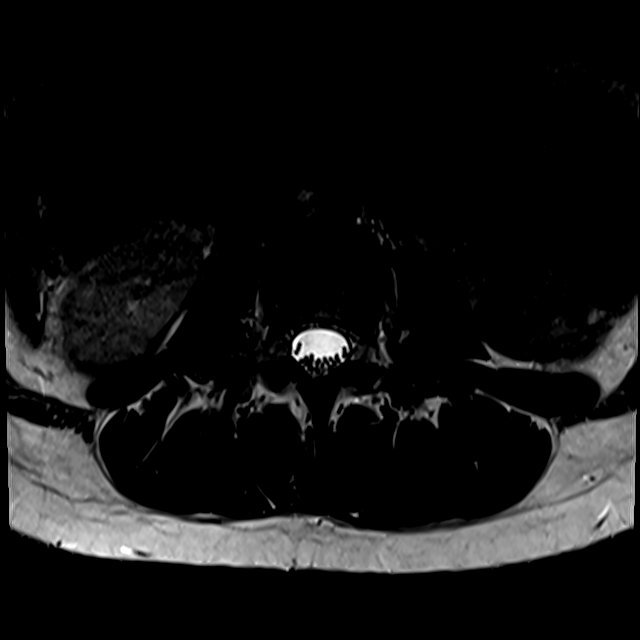
[im 21/37]
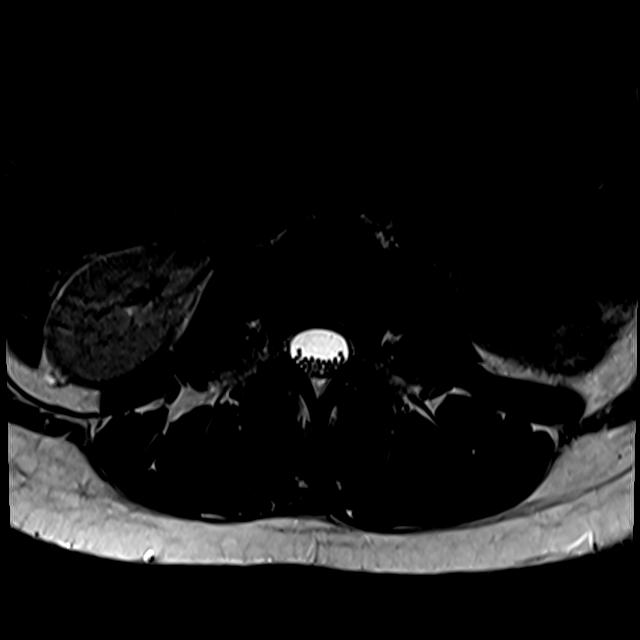
[im 31/37]
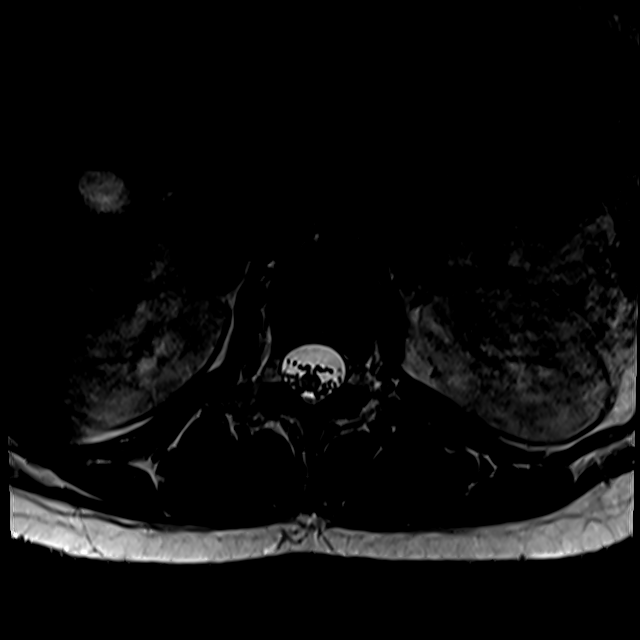

[19 of 48 positions shown; findings below may reference images not displayed]

FINDINGS: Segmentation: There are five lumbar type vertebral bodies. The last
full intervertebral disc space is labeled L5-S1. This correlates
with the radiographs.

Alignment:  Normal

Vertebrae:  Normal marrow signal.  No bone lesions or fractures.

Conus medullaris and cauda equina: Conus extends to the bottom of L1
level. Conus and cauda equina appear normal.

Paraspinal and other soft tissues: No significant paraspinal or
retroperitoneal findings.

Disc levels:

No lumbar disc protrusions, spinal or foraminal stenosis.
IMPRESSION: Unremarkable lumbar spine MRI examination.
